# Patient Record
Sex: Male | Born: 1955 | Race: Black or African American | Hispanic: No | Marital: Single | Smoking: Current every day smoker
Health system: Southern US, Community
[De-identification: ages and names within clinical notes are randomized; demographics above are authoritative.]

## PROBLEM LIST (undated history)

## (undated) DIAGNOSIS — M25569 Pain in unspecified knee: Secondary | ICD-10-CM

## (undated) DIAGNOSIS — K219 Gastro-esophageal reflux disease without esophagitis: Secondary | ICD-10-CM

## (undated) HISTORY — PX: APPENDECTOMY: SHX54

---

## 2003-11-03 ENCOUNTER — Other Ambulatory Visit: Payer: Self-pay

## 2006-07-16 ENCOUNTER — Emergency Department: Payer: Self-pay | Admitting: Emergency Medicine

## 2008-04-09 ENCOUNTER — Emergency Department: Payer: Self-pay | Admitting: Unknown Physician Specialty

## 2012-02-18 ENCOUNTER — Emergency Department (HOSPITAL_COMMUNITY): Payer: Self-pay

## 2012-02-18 ENCOUNTER — Emergency Department (HOSPITAL_COMMUNITY)
Admission: EM | Admit: 2012-02-18 | Discharge: 2012-02-18 | Disposition: A | Discharge status: Home / Self Care | Payer: Self-pay | Attending: Emergency Medicine | Admitting: Emergency Medicine

## 2012-02-18 ENCOUNTER — Encounter (HOSPITAL_COMMUNITY): Payer: Self-pay | Admitting: Emergency Medicine

## 2012-02-18 DIAGNOSIS — M25562 Pain in left knee: Secondary | ICD-10-CM

## 2012-02-18 DIAGNOSIS — M25569 Pain in unspecified knee: Secondary | ICD-10-CM | POA: Insufficient documentation

## 2012-02-18 MED ORDER — IBUPROFEN 600 MG PO TABS: 600.0000 mg | ORAL_TABLET | Freq: Four times a day (QID) | ORAL | Status: AC | PRN

## 2012-02-18 MED ORDER — TRAMADOL HCL 50 MG PO TABS: 50.0000 mg | ORAL_TABLET | Freq: Four times a day (QID) | ORAL | Status: AC | PRN

## 2012-02-18 NOTE — ED Notes (Signed)
Pt st's he has been having pain  In left knee approx 1 month.  No known injury.  St's knee has been swelling for approx 1 week

## 2012-02-18 NOTE — Progress Notes (Signed)
Orthopedic Tech Progress Note Patient Details:  Mitchell David 02/21/1956 [id number]  Ortho Devices Type of Ortho Device: Knee Sleeve Ortho Device/Splint Location: left knee Ortho Device/Splint Interventions: Application   Mitchell David 02/18/2012, 9:02 PM

## 2012-02-18 NOTE — ED Provider Notes (Signed)
History     CSN: [id number]  Arrival date & time 02/18/12  1804   First MD Initiated Contact with Patient 02/18/12 1941      Chief Complaint  Patient presents with  . Knee Pain    (Consider location/radiation/quality/duration/timing/severity/associated sxs/prior treatment) Patient is a 56 y.o. male presenting with knee pain. The history is provided by the patient.  Knee Pain This is a new problem. The current episode started 1 to 4 weeks ago. The problem occurs constantly. The problem has been gradually worsening. Associated symptoms include arthralgias and joint swelling. Pertinent negatives include no chills, fever, numbness or weakness. The symptoms are aggravated by walking, twisting, standing and bending. He has tried nothing for the symptoms.  Pt states no known injury. Pain worsened with working on it, bending, walking. No treatments tried, just applied oil with no relief. NO hx of knee problems in the past.   History reviewed. No pertinent past medical history.  Past Surgical History  Procedure Date  . Abdominal surgery     No family history on file.  History  Substance Use Topics  . Smoking status: Never Smoker   . Smokeless tobacco: Not on file  . Alcohol Use: No      Review of Systems  Constitutional: Negative for fever and chills.  Respiratory: Negative.   Cardiovascular: Negative.   Musculoskeletal: Positive for joint swelling and arthralgias.  Skin: Negative.   Neurological: Negative for weakness and numbness.    Allergies  Review of patient's allergies indicates no known allergies.  Home Medications   Current Outpatient Rx  Name Route Sig Dispense Refill  . ADULT MULTIVITAMIN W/MINERALS CH Oral Take 1 tablet by mouth daily.    Marland Kitchen RANITIDINE HCL 150 MG PO TABS Oral Take 150 mg by mouth 2 (two) times daily.      BP 126/60  Pulse 54  Temp 97.6 F (36.4 C) (Oral)  Resp 16  SpO2 96%  Physical Exam  Nursing note and vitals  reviewed. Constitutional: He is oriented to person, place, and time. He appears well-developed and well-nourished. No distress.  HENT:  Head: Normocephalic.  Eyes: Conjunctivae are normal.  Cardiovascular: Normal rate, regular rhythm and normal heart sounds.   Pulmonary/Chest: Effort normal and breath sounds normal. No respiratory distress. He has no wheezes. He has no rales.  Musculoskeletal:       Normal appearing bilateral knees. No tenderness to palpation over left knee.pain WIth ROM of the left knee, pain with flexion, limited flexion due to pain. Negative anterior, posterior drawer signs. No laxity with medial or lateral stress.   Neurological: He is alert and oriented to person, place, and time.  Skin: Skin is warm and dry.  Psychiatric: He has a normal mood and affect.    ED Course  Procedures (including critical care time)  Pt with knee pain. No injury. No swelling or signs of infection on exam. Joint stable. Will get x-ray  No results found for this or any previous visit. Dg Knee Complete 4 Views Left  02/18/2012  *RADIOLOGY REPORT*  Clinical Data: Left knee swelling  LEFT KNEE - COMPLETE 4+ VIEW  Comparison: None.  Findings: No acute fracture or dislocation is identified.  Mild soft tissue changes are noted.  No joint effusion is seen.  IMPRESSION: No acute bony abnormality is noted.  Original Report Authenticated By: Phillips Odor, M.D.    X-ray negative, pt ambulatory. Suspect arthritis vs tendonitis. D/c home with anti inflammatories, follow up.  1. Pain in left knee       MDM         Lottie Mussel, PA 02/18/12 2130

## 2012-02-21 NOTE — ED Provider Notes (Signed)
Medical screening examination/treatment/procedure(s) were performed by non-physician practitioner and as supervising physician I was immediately available for consultation/collaboration.  Raeford Razor, MD 02/21/12 1257

## 2012-02-25 ENCOUNTER — Encounter (HOSPITAL_COMMUNITY): Payer: Self-pay | Admitting: Emergency Medicine

## 2012-02-25 ENCOUNTER — Emergency Department (INDEPENDENT_AMBULATORY_CARE_PROVIDER_SITE_OTHER)
Admission: EM | Admit: 2012-02-25 | Discharge: 2012-02-25 | Disposition: A | Discharge status: Home / Self Care | Payer: Self-pay | Source: Home / Self Care | Attending: Emergency Medicine | Admitting: Emergency Medicine

## 2012-02-25 DIAGNOSIS — M25569 Pain in unspecified knee: Secondary | ICD-10-CM

## 2012-02-25 DIAGNOSIS — M25562 Pain in left knee: Secondary | ICD-10-CM

## 2012-02-25 HISTORY — DX: Gastro-esophageal reflux disease without esophagitis: K21.9

## 2012-02-25 HISTORY — DX: Pain in unspecified knee: M25.569

## 2012-02-25 MED ORDER — METHYLPREDNISOLONE 4 MG PO KIT: PACK | ORAL | Status: AC

## 2012-02-25 MED ORDER — ACETAMINOPHEN ER 650 MG PO TBCR: 650.0000 mg | EXTENDED_RELEASE_TABLET | Freq: Three times a day (TID) | ORAL | Status: AC | PRN

## 2012-02-25 MED ORDER — DEXAMETHASONE SODIUM PHOSPHATE 10 MG/ML IJ SOLN
INTRAMUSCULAR | Status: AC
  Filled 2012-02-25: qty 1

## 2012-02-25 MED ORDER — DEXAMETHASONE SODIUM PHOSPHATE 10 MG/ML IJ SOLN
10.0000 mg | Freq: Once | INTRAMUSCULAR | Status: AC
  Administered 2012-02-25: 10 mg via INTRAMUSCULAR

## 2012-02-25 NOTE — ED Notes (Signed)
Requested patient place on gown for physician examination

## 2012-02-25 NOTE — ED Provider Notes (Signed)
History     CSN: [id number]  Arrival date & time 02/25/12  1234   First MD Initiated Contact with Patient 02/25/12 1304      Chief Complaint  Patient presents with  . Knee Pain    (Consider location/radiation/quality/duration/timing/severity/associated sxs/prior treatment) The history is provided by the patient.   Complains of left knee pain for eight days.  Mechaniism of injury: none. Symptoms have been intermittent, not improved.  Seen in [facility] ER for same and discharged on NSAIDS.  Noted to be worse with exercise, increasingly worse after while performing job which entails kneeling and walking.  Denies prior history of related problems: no prior problems with this area in the past.  Has taken medication at home for pain as directed.  Wearing knee brace daily. Past Medical History  Diagnosis Date  . GERD (gastroesophageal reflux disease)   . Knee pain     Past Surgical History  Procedure Date  . Appendectomy     No family history on file.  History  Substance Use Topics  . Smoking status: Never Smoker   . Smokeless tobacco: Not on file  . Alcohol Use: No      Review of Systems  Constitutional: Negative.   Respiratory: Negative.   Cardiovascular: Negative.   Musculoskeletal: Positive for arthralgias. Negative for joint swelling and gait problem.    Allergies  Review of patient's allergies indicates no known allergies.  Home Medications   Current Outpatient Rx  Name Route Sig Dispense Refill  . IBUPROFEN 600 MG PO TABS Oral Take 1 tablet (600 mg total) by mouth every 6 (six) hours as needed for pain. 30 tablet 0  . ACETAMINOPHEN ER 650 MG PO TBCR Oral Take 1 tablet (650 mg total) by mouth every 8 (eight) hours as needed for pain. 60 tablet 1  . METHYLPREDNISOLONE 4 MG PO KIT  follow package directions 21 tablet 0  . ADULT MULTIVITAMIN W/MINERALS CH Oral Take 1 tablet by mouth daily.    Marland Kitchen RANITIDINE HCL 150 MG PO TABS Oral Take 150 mg by mouth 2 (two) times  daily.    . TRAMADOL HCL 50 MG PO TABS Oral Take 1 tablet (50 mg total) by mouth every 6 (six) hours as needed for pain. 15 tablet 0    BP 118/61  Pulse 46  Temp 98 F (36.7 C) (Oral)  Resp 16  SpO2 100%  Physical Exam  Nursing note and vitals reviewed. Constitutional: He is oriented to person, place, and time. Vital signs are normal. He appears well-developed and well-nourished. He is active and cooperative.  HENT:  Head: Normocephalic.  Eyes: Conjunctivae are normal. Pupils are equal, round, and reactive to light. No scleral icterus.  Neck: Trachea normal. Neck supple.  Cardiovascular: Normal rate, regular rhythm, intact distal pulses and normal pulses.   Pulmonary/Chest: Effort normal and breath sounds normal.  Musculoskeletal:       Left hip: Normal.       Right knee: Normal.       Left knee: Normal.       Left ankle: Normal.       Left lower leg: Normal.       Left foot: Normal.  Neurological: He is alert and oriented to person, place, and time. He has normal strength. No cranial nerve deficit or sensory deficit. GCS eye subscore is 4. GCS verbal subscore is 5. GCS motor subscore is 6.  Skin: Skin is warm and dry.  Psychiatric: He has a  normal mood and affect. His speech is normal and behavior is normal. Judgment and thought content normal. Cognition and memory are normal.    ED Course  Procedures (including critical care time)  Labs Reviewed - No data to display No results found.   1. Left knee pain       MDM  Reviewed notes, imaging and medications from previous visit.  IM Steroids administered in office.  Continue knee sleeve daily, take ibuprofen 600mg  bid for two weeks, begin steroids and tylenol arthritis as prescribed.        Johnsie Kindred, NP 02/25/12 1622

## 2012-02-25 NOTE — ED Notes (Signed)
Reports left knee issue for one month.  Reports knee just felt different.    Reports knee "gave out" about 2 weeks ago.  Was seen in [place]-received x-rays and pills.  Pain has worsened.

## 2012-02-25 NOTE — ED Notes (Signed)
[name] np at bedside

## 2012-02-25 NOTE — ED Provider Notes (Signed)
Medical screening examination/treatment/procedure(s) were performed by non-physician practitioner and as supervising physician I was immediately available for consultation/collaboration.  Leslee Home, M.D.   Reuben Likes, MD 02/25/12 2200

## 2012-05-12 ENCOUNTER — Encounter (HOSPITAL_COMMUNITY): Payer: Self-pay | Admitting: Emergency Medicine

## 2012-05-12 ENCOUNTER — Emergency Department (INDEPENDENT_AMBULATORY_CARE_PROVIDER_SITE_OTHER)
Admission: EM | Admit: 2012-05-12 | Discharge: 2012-05-12 | Disposition: A | Discharge status: Home / Self Care | Payer: Self-pay | Source: Home / Self Care | Attending: Family Medicine | Admitting: Family Medicine

## 2012-05-12 DIAGNOSIS — M1712 Unilateral primary osteoarthritis, left knee: Secondary | ICD-10-CM

## 2012-05-12 DIAGNOSIS — M1710 Unilateral primary osteoarthritis, unspecified knee: Secondary | ICD-10-CM

## 2012-05-12 MED ORDER — NAPROXEN 500 MG PO TABS: 500.0000 mg | ORAL_TABLET | Freq: Two times a day (BID) | ORAL | Status: DC

## 2012-05-12 MED ORDER — TRAMADOL HCL 50 MG PO TABS: 50.0000 mg | ORAL_TABLET | Freq: Three times a day (TID) | ORAL | Status: DC | PRN

## 2012-05-12 NOTE — ED Provider Notes (Signed)
History     CSN: [id number]  Arrival date & time 05/12/12  1722   First MD Initiated Contact with Patient 05/12/12 1731      Chief Complaint  Patient presents with  . Knee Pain    (Consider location/radiation/quality/duration/timing/severity/associated sxs/prior treatment) HPI Comments: 56 year old male with history of chronic bilateral knee pain here complaining of left knee pain constant with periods of exacerbation during the last month. He wears a left knee brace and had normal left knee x-ray on July 30/2013 available for my review. Patient reports that he works in an [employer]/rest[employer] standing or walking most of the day. Hes knee pain is worse at the end of the day. Taking Advil and Tylenol inconsistently. Has had a history of acid reflux in the past but no history of ulcers. Denies abdominal pain or melena. No current knee swelling, redness or increased temperature. Denies fever or chills. Denies general malaise. Denies recent falls or direct injury to his knee.   Past Medical History  Diagnosis Date  . GERD (gastroesophageal reflux disease)   . Knee pain     Past Surgical History  Procedure Date  . Appendectomy     Family History  Problem Relation Age of Onset  . Diabetes Father   . Hypertension Father     History  Substance Use Topics  . Smoking status: Never Smoker   . Smokeless tobacco: Not on file  . Alcohol Use: No      Review of Systems  Constitutional: Negative for fever and chills.  Gastrointestinal: Negative for abdominal pain.  Musculoskeletal:       Knee pain as per history of present illness  Skin: Negative for rash.  All other systems reviewed and are negative.    Allergies  Review of patient's allergies indicates no known allergies.  Home Medications   Current Outpatient Rx  Name Route Sig Dispense Refill  . ACETAMINOPHEN ER 650 MG PO TBCR Oral Take 1 tablet (650 mg total) by mouth every 8 (eight) hours as needed for pain. 60 tablet 1    . ADULT MULTIVITAMIN W/MINERALS CH Oral Take 1 tablet by mouth daily.    Marland Kitchen NAPROXEN 500 MG PO TABS Oral Take 1 tablet (500 mg total) by mouth 2 (two) times daily. 20 tablet 0  . RANITIDINE HCL 150 MG PO TABS Oral Take 150 mg by mouth 2 (two) times daily.    . TRAMADOL HCL 50 MG PO TABS Oral Take 1 tablet (50 mg total) by mouth every 8 (eight) hours as needed for pain. 20 tablet 0    BP 117/83  Pulse 58  Temp 98.1 F (36.7 C) (Oral)  Resp 16  SpO2 98%  Physical Exam  Nursing note and vitals reviewed. Constitutional: He is oriented to person, place, and time. He appears well-developed and well-nourished. No distress.  Cardiovascular: Normal rate and regular rhythm.   Pulmonary/Chest: Effort normal and breath sounds normal.  Abdominal: Soft. There is no tenderness.  Musculoskeletal:       Left knee: No abvious swelling or deformity. No redness or increased temperature. No palpable effusion. Reported pain with palpation below and lateral to the patella. No hyper laxity on stress valgo or varus. No patella dislocation . Crepitus fell with flexion and extension and also patient reported pain with active and passive movement. Negative drawer test. Do not feel Baker's cyst. Right lower extremity with normal superficial sensation and proprioception.  Patent dorsal pedal and tibial posterior pulses. Patient is weightbearing  with discomfort.  Neurological: He is alert and oriented to person, place, and time.  Skin: He is not diaphoretic.    ED Course  Procedures (including critical care time)  Labs Reviewed - No data to display No results found.   1. Osteoarthritis of left knee       MDM  Impress chronic osteoarthritic knee pain. Prescribe naproxen and tramadol. Asked to take and stayed with meals. Also recommended to take Prilosec while taking naproxen. Continue to use knee brace. Recommended walking shoes. Knee exercises discussed with patient and handout provided. Orthopedic  referral provided as needed.        Sharin Grave, MD 05/13/12 1541

## 2012-05-12 NOTE — ED Notes (Signed)
Pt c/o bilateral knee pain. Most pain is felt in left knee. Pt states that he has a hx of fluid on knee and arthritis.   Pt states that his left knee has been giving out on him and is experiencing a constant sharp pain.   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

## 2016-02-18 ENCOUNTER — Emergency Department (HOSPITAL_COMMUNITY)
Admission: EM | Admit: 2016-02-18 | Discharge: 2016-02-18 | Disposition: A | Discharge status: Home / Self Care | Payer: No Typology Code available for payment source | Attending: Emergency Medicine | Admitting: Emergency Medicine

## 2016-02-18 ENCOUNTER — Encounter (HOSPITAL_COMMUNITY): Payer: Self-pay

## 2016-02-18 DIAGNOSIS — M545 Low back pain, unspecified: Secondary | ICD-10-CM

## 2016-02-18 DIAGNOSIS — Y92410 Unspecified street and highway as the place of occurrence of the external cause: Secondary | ICD-10-CM | POA: Insufficient documentation

## 2016-02-18 DIAGNOSIS — M542 Cervicalgia: Secondary | ICD-10-CM | POA: Diagnosis not present

## 2016-02-18 DIAGNOSIS — Z79899 Other long term (current) drug therapy: Secondary | ICD-10-CM | POA: Insufficient documentation

## 2016-02-18 DIAGNOSIS — Y939 Activity, unspecified: Secondary | ICD-10-CM | POA: Insufficient documentation

## 2016-02-18 DIAGNOSIS — Y999 Unspecified external cause status: Secondary | ICD-10-CM | POA: Insufficient documentation

## 2016-02-18 DIAGNOSIS — M549 Dorsalgia, unspecified: Secondary | ICD-10-CM | POA: Diagnosis present

## 2016-02-18 MED ORDER — NAPROXEN 500 MG PO TABS: 500.0000 mg | ORAL_TABLET | Freq: Two times a day (BID) | ORAL | 0 refills | Status: DC

## 2016-02-18 MED ORDER — CYCLOBENZAPRINE HCL 10 MG PO TABS: 10.0000 mg | ORAL_TABLET | Freq: Two times a day (BID) | ORAL | 0 refills | Status: DC | PRN

## 2016-02-18 NOTE — ED Notes (Signed)
The pts first request was asking for food
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
## 2016-02-18 NOTE — Discharge Instructions (Signed)
Take the Flexeril as prescribed. Do not drink alcohol, drive, or operate machinery while taking this medication as it causes drowsiness. Take the naproxen as needed for pain. Make sure you eat with his medications as it can be hard in your stomach.  Return to the emergency department if you experience worsening symptoms, worsening pain, numbness/timing, weakness, nausea, vomiting, headache, dizziness, visual changes or any other concerning symptoms.

## 2016-02-18 NOTE — ED Notes (Signed)
See triage note  Pt c/o the wait
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
## 2016-02-26 ENCOUNTER — Emergency Department (HOSPITAL_COMMUNITY)
Admission: EM | Admit: 2016-02-26 | Discharge: 2016-02-26 | Disposition: A | Discharge status: Home / Self Care | Payer: No Typology Code available for payment source | Attending: Emergency Medicine | Admitting: Emergency Medicine

## 2016-02-26 ENCOUNTER — Encounter (HOSPITAL_COMMUNITY): Payer: Self-pay | Admitting: Emergency Medicine

## 2016-02-26 ENCOUNTER — Emergency Department (HOSPITAL_COMMUNITY): Payer: No Typology Code available for payment source

## 2016-02-26 DIAGNOSIS — Y939 Activity, unspecified: Secondary | ICD-10-CM | POA: Diagnosis not present

## 2016-02-26 DIAGNOSIS — M545 Low back pain: Secondary | ICD-10-CM | POA: Insufficient documentation

## 2016-02-26 DIAGNOSIS — Y999 Unspecified external cause status: Secondary | ICD-10-CM | POA: Diagnosis not present

## 2016-02-26 DIAGNOSIS — Y92410 Unspecified street and highway as the place of occurrence of the external cause: Secondary | ICD-10-CM | POA: Insufficient documentation

## 2016-02-26 DIAGNOSIS — M549 Dorsalgia, unspecified: Secondary | ICD-10-CM

## 2016-02-26 MED ORDER — METHOCARBAMOL 500 MG PO TABS: 500.0000 mg | ORAL_TABLET | Freq: Two times a day (BID) | ORAL | 0 refills | Status: DC

## 2016-02-26 MED ORDER — IBUPROFEN 800 MG PO TABS: 800.0000 mg | ORAL_TABLET | Freq: Three times a day (TID) | ORAL | 0 refills | Status: DC

## 2016-02-26 NOTE — Discharge Instructions (Signed)
Take the prescribed medication as directed.  May wish to use heat to help relax muscles in your back as well (hot shower, heating pad, etc). Follow-up with the wellness clinic if symptoms do not improve over the next few days.  Call to make an appt. Return to the ED for new or worsening symptoms.

## 2016-02-26 NOTE — ED Notes (Signed)
Pt at X-ray
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
## 2019-11-04 ENCOUNTER — Ambulatory Visit: Payer: Self-pay | Attending: Internal Medicine

## 2019-11-04 DIAGNOSIS — Z23 Encounter for immunization: Secondary | ICD-10-CM

## 2019-11-04 NOTE — Progress Notes (Signed)
   [id number]  Name:  Floy Riegler.    MRN: [id number] DOB: 1956-02-19  11/04/2019  Mr. Bram was observed post Covid-19 immunization for 15 minutes without incident. He was provided with Vaccine Information Sheet and instruction to access the V-Safe system.   Mr. Marlatt was instructed to call 911 with any severe reactions post vaccine: Marland Kitchen Difficulty breathing  . Swelling of face and throat  . A fast heartbeat  . A bad rash all over body  . Dizziness and weakness   Immunizations Administered    Name Date Dose VIS Date Route   Moderna COVID-19 Vaccine 11/04/2019 11:06 AM 0.5 mL 06/22/2019 Intramuscular   Manufacturer: Moderna   Lot: [id number]   NDC: 86773-736-68

## 2019-12-02 ENCOUNTER — Ambulatory Visit: Payer: Self-pay | Attending: Internal Medicine

## 2019-12-02 DIAGNOSIS — Z23 Encounter for immunization: Secondary | ICD-10-CM

## 2019-12-02 NOTE — Progress Notes (Signed)
   [id number]  Name:  Jared Whorley.    MRN: [id number] DOB: [birth year]  12/02/2019  Mr. Schollmeyer was observed post Covid-19 immunization for 15 minutes without incident. He was provided with Vaccine Information Sheet and instruction to access the V-Safe system.   Mr. Chlebowski was instructed to call 911 with any severe reactions post vaccine: Marland Kitchen Difficulty breathing  . Swelling of face and throat  . A fast heartbeat  . A bad rash all over body  . Dizziness and weakness   Immunizations Administered    Name Date Dose VIS Date Route   Moderna COVID-19 Vaccine 12/02/2019 10:36 AM 0.5 mL 06/2019 Intramuscular   Manufacturer: Moderna   Lot: [id number]   NDC: 83419-622-29

## 2019-12-09 ENCOUNTER — Ambulatory Visit: Payer: Self-pay

## 2021-05-09 DIAGNOSIS — W19XXXA Unspecified fall, initial encounter: Secondary | ICD-10-CM

## 2021-05-09 HISTORY — DX: Unspecified fall, initial encounter: W19.XXXA

## 2021-05-11 ENCOUNTER — Emergency Department (HOSPITAL_COMMUNITY): Payer: Self-pay

## 2021-05-11 ENCOUNTER — Emergency Department (HOSPITAL_COMMUNITY)
Admission: EM | Admit: 2021-05-11 | Discharge: 2021-05-11 | Disposition: A | Discharge status: Home / Self Care | Payer: Self-pay | Attending: Emergency Medicine | Admitting: Emergency Medicine

## 2021-05-11 DIAGNOSIS — Y939 Activity, unspecified: Secondary | ICD-10-CM | POA: Insufficient documentation

## 2021-05-11 DIAGNOSIS — S9032XA Contusion of left foot, initial encounter: Secondary | ICD-10-CM | POA: Insufficient documentation

## 2021-05-11 DIAGNOSIS — W11XXXA Fall on and from ladder, initial encounter: Secondary | ICD-10-CM | POA: Insufficient documentation

## 2021-05-11 MED ORDER — NAPROXEN 375 MG PO TABS: 375.0000 mg | ORAL_TABLET | Freq: Two times a day (BID) | ORAL | 0 refills | Status: DC

## 2021-05-11 NOTE — Discharge Instructions (Signed)
Get help right away if: You have severe pain. Your foot or toes become numb. Your foot or toes become pale or cold. You cannot move your foot or ankle. Your foot is warm to the touch. 

## 2021-05-11 NOTE — ED Triage Notes (Signed)
Pt. Stated, I sort of fell from the ladder from about 8 ft. Came down on one side of ladder and landed on left heel of my foot. I was cleaning out gutters yesterday afternoon.

## 2021-05-11 NOTE — ED Provider Notes (Signed)
[facility] EMERGENCY DEPARTMENT Provider Note   CSN: [id number] Arrival date & time: 05/11/21  9628     History Chief Complaint  Patient presents with   Fall   Foot Injury    Nekhi Liwanag. is a 65 y.o. male who presents emergency department chief complaint of left heel pain.  Yesterday the patient was on a ladder cleaning gutters.  Patient states "the ladder went one way and I went the other."  Patient landed on his left heel.  He states that since that time he has had significant pain in the left heel, difficulty ambulating.  He denies back pain.  He denies pain in the left hip or knee joint.  Patient denies hitting his head or losing consciousness.  He denies any numbness or tingling in the left foot.  He has been able to toe-touch ambulate on the left   Fall  Foot Injury     Past Medical History:  Diagnosis Date   GERD (gastroesophageal reflux disease)    Knee pain     There are no problems to display for this patient.   Past Surgical History:  Procedure Laterality Date   APPENDECTOMY         Family History  Problem Relation Age of Onset   Diabetes Father    Hypertension Father     Social History   Tobacco Use   Smoking status: Never   Smokeless tobacco: Never  Substance Use Topics   Alcohol use: No   Drug use: No    Home Medications Prior to Admission medications   Medication Sig Start Date End Date Taking? Authorizing Provider  cyclobenzaprine (FLEXERIL) 10 MG tablet Take 1 tablet (10 mg total) by mouth 2 (two) times daily as needed for muscle spasms. 02/18/16   Focht, Joyce Copa, PA  ibuprofen (ADVIL,MOTRIN) 800 MG tablet Take 1 tablet (800 mg total) by mouth 3 (three) times daily. 02/26/16   Garlon Hatchet, PA-C  methocarbamol (ROBAXIN) 500 MG tablet Take 1 tablet (500 mg total) by mouth 2 (two) times daily. 02/26/16   Garlon Hatchet, PA-C  Multiple Vitamin (MULTIVITAMIN WITH MINERALS) TABS Take 1 tablet by mouth daily.     [provider]  ranitidine (ZANTAC) 150 MG tablet Take 150 mg by mouth 2 (two) times daily.    [provider]  traMADol (ULTRAM) 50 MG tablet Take 1 tablet (50 mg total) by mouth every 8 (eight) hours as needed for pain. 05/12/12   Moreno-Coll, Adlih, MD    Allergies    Patient has no known allergies.  Review of Systems   Review of Systems  Musculoskeletal:  Positive for gait problem.  Skin:  Negative for wound.  Neurological:  Negative for weakness and numbness.   Physical Exam Updated Vital Signs BP (!) 145/70 (BP Location: Right Arm)   Pulse (!) 55   Temp 99.2 F (37.3 C) (Oral)   Resp 18   SpO2 98%   Physical Exam Vitals and nursing note reviewed.  Constitutional:      General: He is not in acute distress.    Appearance: He is well-developed. He is not diaphoretic.  HENT:     Head: Normocephalic and atraumatic.  Eyes:     General: No scleral icterus.    Conjunctiva/sclera: Conjunctivae normal.  Cardiovascular:     Rate and Rhythm: Normal rate and regular rhythm.     Heart sounds: Normal heart sounds.  Pulmonary:     Effort:  Pulmonary effort is normal. No respiratory distress.     Breath sounds: Normal breath sounds.  Abdominal:     Palpations: Abdomen is soft.     Tenderness: There is no abdominal tenderness.  Musculoskeletal:     Cervical back: Normal range of motion and neck supple.     Comments: Left heel is exquisitely tender to palpation, there is no obvious bruising, swelling.  Normal ipsilateral left ankle and knee examination.  DP 2+ patient is able to wiggle toes.  Skin:    General: Skin is warm and dry.  Neurological:     Mental Status: He is alert.  Psychiatric:        Behavior: Behavior normal.    ED Results / Procedures / Treatments   Labs (all labs ordered are listed, but only abnormal results are displayed) Labs Reviewed - No data to display  EKG None  Radiology DG Foot Complete Left  Result Date:  05/11/2021 CLINICAL DATA:  Fall, left heel and foot/ankle swelling EXAM: LEFT FOOT - COMPLETE 3+ VIEW COMPARISON:  None. FINDINGS: There is no acute fracture or dislocation. Alignment is normal. The Lisfranc and Chopart joints are intact. The soft tissues are unremarkable. IMPRESSION: No acute fracture or dislocation Electronically Signed   By: Lesia Hausen M.D.   On: 05/11/2021 09:22    Procedures Procedures   Medications Ordered in ED Medications - No data to display  ED Course  I have reviewed the triage vital signs and the nursing notes.  Pertinent labs & imaging results that were available during my care of the patient were reviewed by me and considered in my medical decision making (see chart for details).    MDM Rules/Calculators/A&P                          65 year old male here with left heel pain.  This appears to be a left heel contusion.  I ordered and reviewed foot x-ray which shows no acute fractures.  Patient has no back pain.  He is ambulatory with toe-touch.  Patient will be given a postop shoe, crutches, anti-inflammatories and outpatient follow-up.  Return precautions given.  Patient is neurovascularly intact and appears appropriate for discharge at this time   Final Clinical Impression(s) / ED Diagnoses Final diagnoses:  Contusion of left foot, initial encounter    Rx / DC Orders ED Discharge Orders     None        Arthor Captain, PA-C 05/11/21 1610    Virgina Norfolk, DO 05/11/21 1017

## 2021-05-17 ENCOUNTER — Other Ambulatory Visit: Payer: Self-pay | Admitting: *Deleted

## 2021-05-17 ENCOUNTER — Other Ambulatory Visit (HOSPITAL_COMMUNITY): Payer: Self-pay

## 2021-05-17 MED ORDER — NAPROXEN 375 MG PO TABS
375.0000 mg | ORAL_TABLET | Freq: Two times a day (BID) | ORAL | 0 refills | Status: AC
  Filled 2021-05-17: qty 20, 10d supply, fill #0

## 2021-06-12 ENCOUNTER — Other Ambulatory Visit: Payer: Self-pay | Admitting: Physician Assistant

## 2021-06-12 ENCOUNTER — Encounter: Payer: Self-pay | Admitting: Physician Assistant

## 2021-06-12 ENCOUNTER — Other Ambulatory Visit: Payer: Self-pay | Admitting: *Deleted

## 2021-06-12 DIAGNOSIS — L602 Onychogryphosis: Secondary | ICD-10-CM
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
## 2021-09-12 ENCOUNTER — Other Ambulatory Visit (HOSPITAL_COMMUNITY): Payer: Self-pay

## 2022-01-23 IMAGING — DX DG FOOT COMPLETE 3+V*L*
3 series · 3 of 3 positions shown · non-contrast
Comparison: None.

CLINICAL DATA: Fall, left heel and foot/ankle swelling

EXAM:
LEFT FOOT - COMPLETE 3+ VIEW

[foot ap]
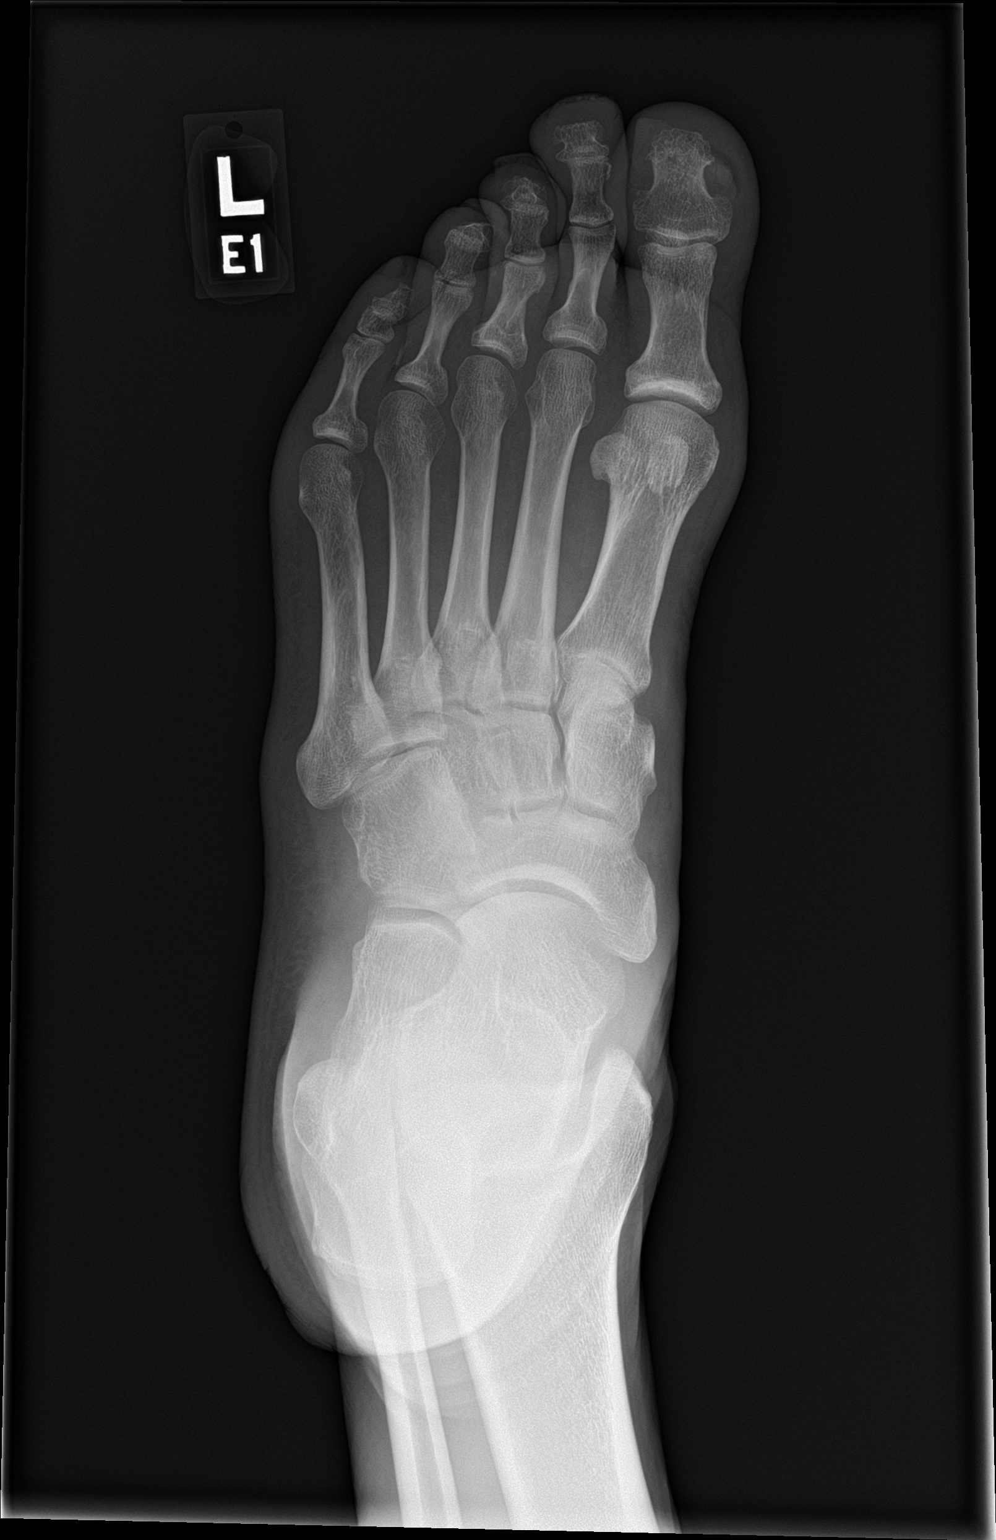

[foot obl]
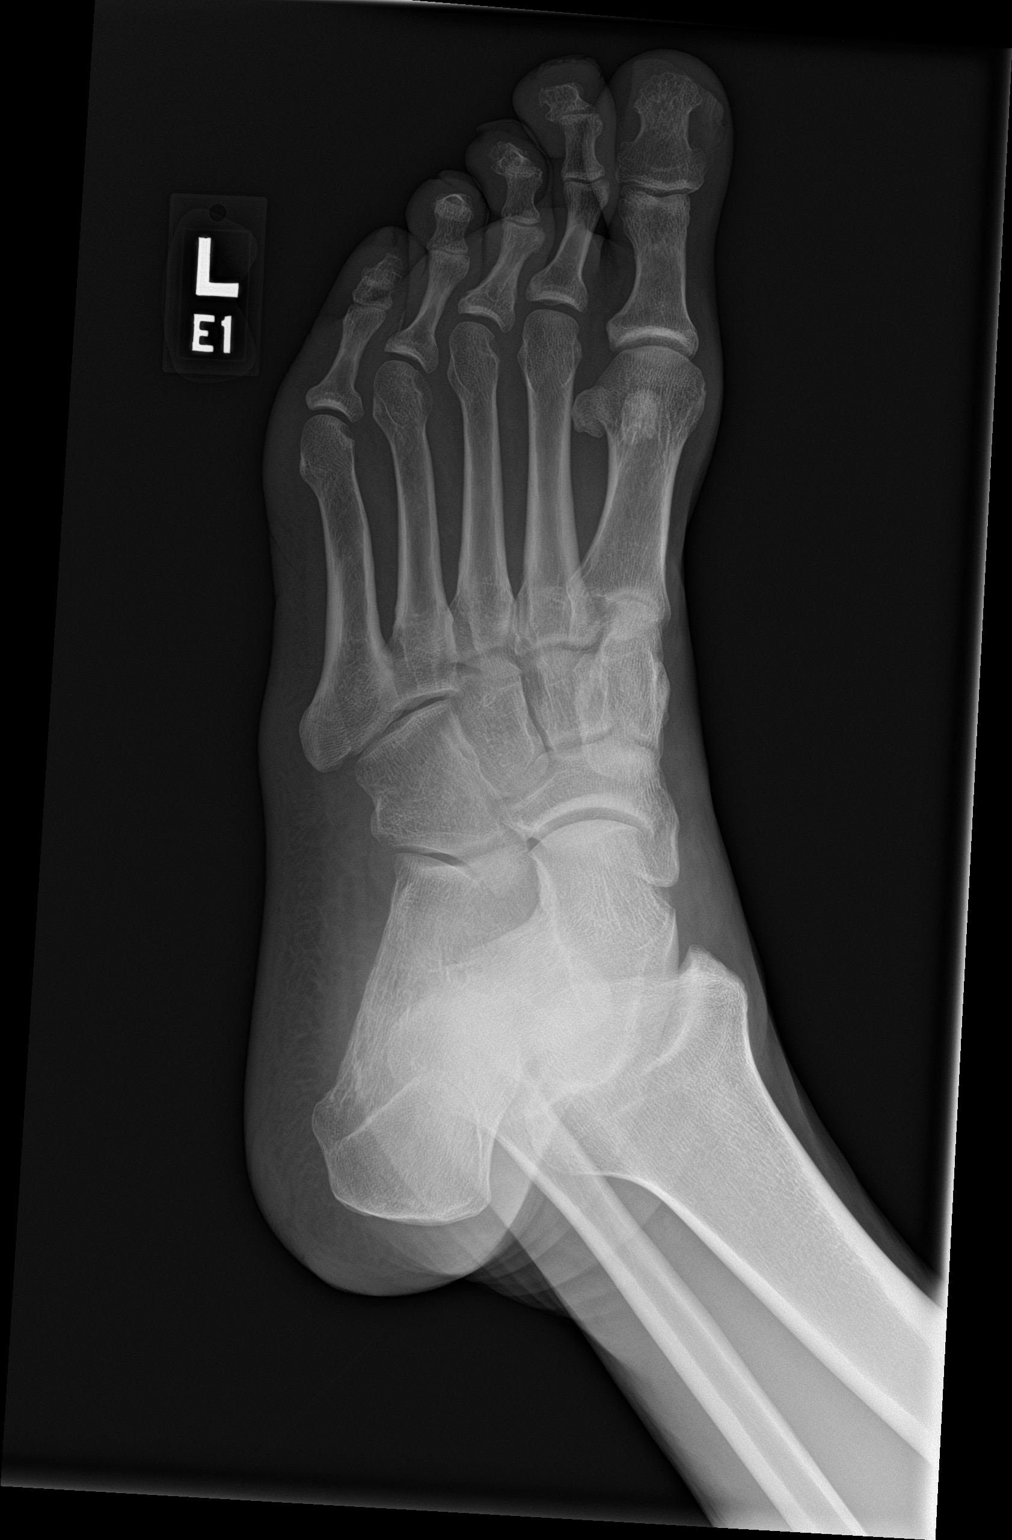

[foot lat]
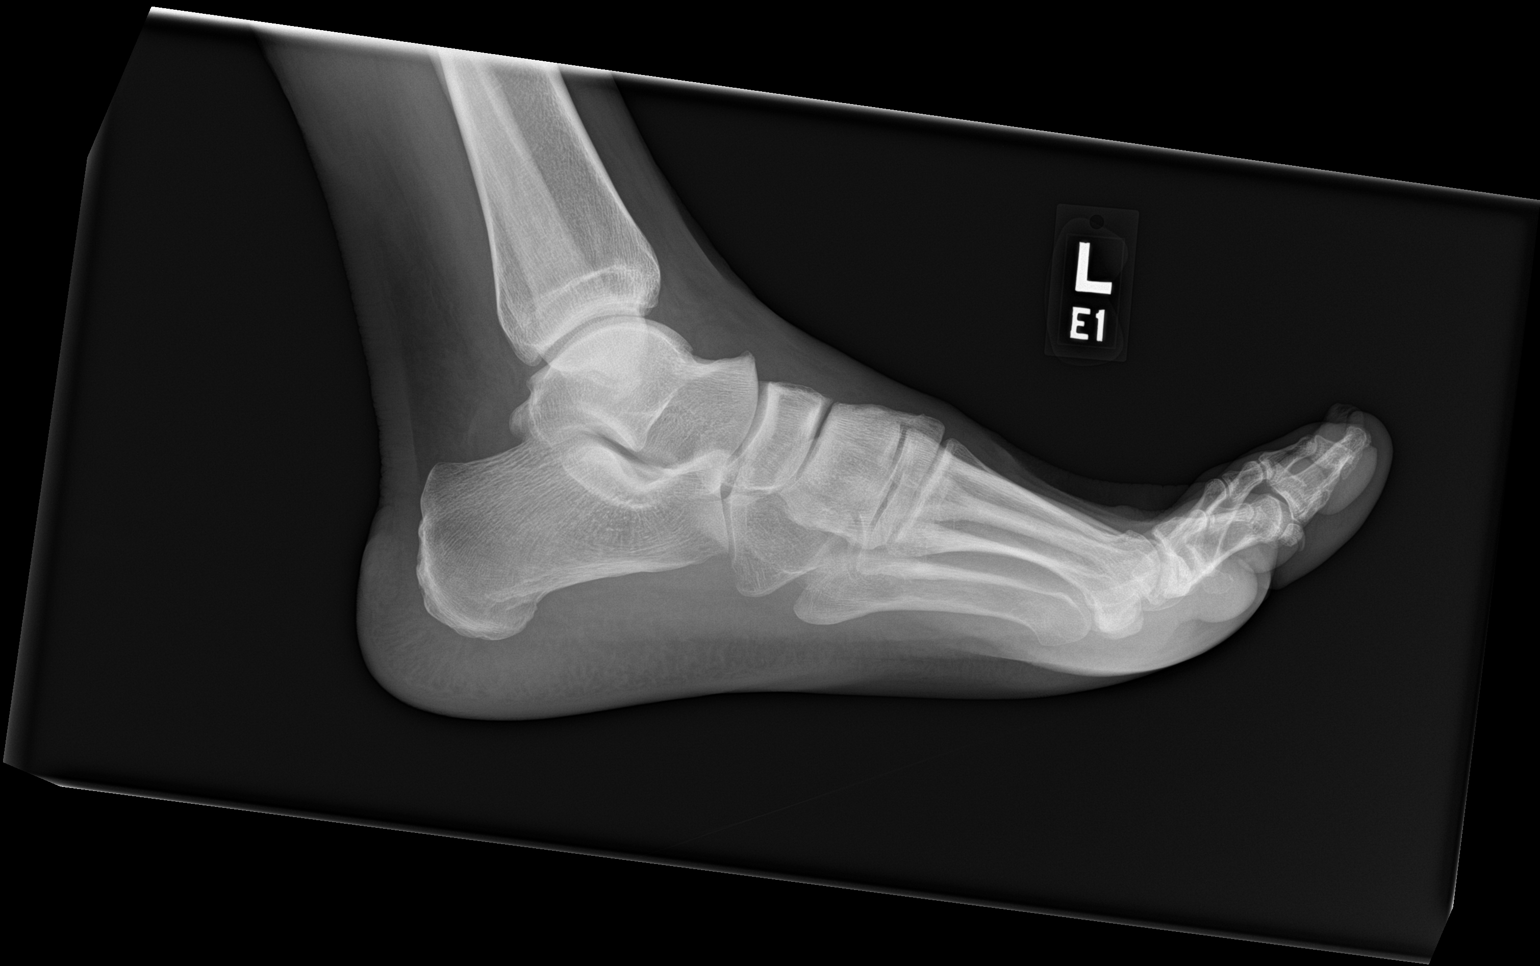

[3 of 3 positions shown; findings below may reference images not displayed]

FINDINGS: There is no acute fracture or dislocation. Alignment is normal. The
Lisfranc and Chopart joints are intact. The soft tissues are
unremarkable.
IMPRESSION: No acute fracture or dislocation

## 2024-03-18 ENCOUNTER — Ambulatory Visit: Admitting: Pediatrics

## 2024-08-13 ENCOUNTER — Ambulatory Visit: Admitting: Family Medicine

## 2024-08-24 ENCOUNTER — Ambulatory Visit: Admitting: Family Medicine

## 2024-09-22 ENCOUNTER — Ambulatory Visit: Admitting: Family Medicine
